# Patient Record
Sex: Male | Born: 1985 | Race: Black or African American | Hispanic: No | Marital: Single | Smoking: Former smoker
Health system: Southern US, Community
[De-identification: ages and names within clinical notes are randomized; demographics above are authoritative.]

## PROBLEM LIST (undated history)

## (undated) HISTORY — PX: OTHER SURGICAL HISTORY: SHX169

---

## 2015-09-29 ENCOUNTER — Emergency Department (HOSPITAL_COMMUNITY): Payer: Managed Care, Other (non HMO)

## 2015-09-29 ENCOUNTER — Encounter (HOSPITAL_COMMUNITY): Payer: Self-pay

## 2015-09-29 ENCOUNTER — Inpatient Hospital Stay (HOSPITAL_COMMUNITY)
Admission: EM | Admit: 2015-09-29 | Discharge: 2015-10-02 | DRG: 964 | Disposition: A | Discharge status: Home / Self Care | Payer: Managed Care, Other (non HMO) | Attending: General Surgery | Admitting: General Surgery

## 2015-09-29 DIAGNOSIS — S069X9A Unspecified intracranial injury with loss of consciousness of unspecified duration, initial encounter: Secondary | ICD-10-CM | POA: Diagnosis present

## 2015-09-29 DIAGNOSIS — S062X9A Diffuse traumatic brain injury with loss of consciousness of unspecified duration, initial encounter: Secondary | ICD-10-CM

## 2015-09-29 DIAGNOSIS — S0181XA Laceration without foreign body of other part of head, initial encounter: Secondary | ICD-10-CM

## 2015-09-29 DIAGNOSIS — S069XAA Unspecified intracranial injury with loss of consciousness status unknown, initial encounter: Secondary | ICD-10-CM | POA: Diagnosis present

## 2015-09-29 DIAGNOSIS — S27322A Contusion of lung, bilateral, initial encounter: Secondary | ICD-10-CM | POA: Diagnosis present

## 2015-09-29 DIAGNOSIS — F10920 Alcohol use, unspecified with intoxication, uncomplicated: Secondary | ICD-10-CM

## 2015-09-29 DIAGNOSIS — S062X0A Diffuse traumatic brain injury without loss of consciousness, initial encounter: Secondary | ICD-10-CM | POA: Diagnosis not present

## 2015-09-29 DIAGNOSIS — S01121A Laceration with foreign body of right eyelid and periocular area, initial encounter: Secondary | ICD-10-CM | POA: Diagnosis present

## 2015-09-29 DIAGNOSIS — F10129 Alcohol abuse with intoxication, unspecified: Secondary | ICD-10-CM | POA: Diagnosis present

## 2015-09-29 DIAGNOSIS — S22009A Unspecified fracture of unspecified thoracic vertebra, initial encounter for closed fracture: Secondary | ICD-10-CM | POA: Diagnosis present

## 2015-09-29 DIAGNOSIS — F17200 Nicotine dependence, unspecified, uncomplicated: Secondary | ICD-10-CM | POA: Diagnosis present

## 2015-09-29 DIAGNOSIS — R413 Other amnesia: Secondary | ICD-10-CM | POA: Diagnosis present

## 2015-09-29 DIAGNOSIS — S27329A Contusion of lung, unspecified, initial encounter: Secondary | ICD-10-CM

## 2015-09-29 DIAGNOSIS — D62 Acute posthemorrhagic anemia: Secondary | ICD-10-CM | POA: Diagnosis not present

## 2015-09-29 DIAGNOSIS — S062XAA Diffuse traumatic brain injury with loss of consciousness status unknown, initial encounter: Secondary | ICD-10-CM

## 2015-09-29 DIAGNOSIS — J982 Interstitial emphysema: Secondary | ICD-10-CM | POA: Diagnosis present

## 2015-09-29 DIAGNOSIS — E876 Hypokalemia: Secondary | ICD-10-CM | POA: Diagnosis present

## 2015-09-29 LAB — COMPREHENSIVE METABOLIC PANEL
ALBUMIN: 3.9 g/dL (ref 3.5–5.0)
ALT: 80 U/L — AB (ref 17–63)
AST: 119 U/L — AB (ref 15–41)
Alkaline Phosphatase: 64 U/L (ref 38–126)
Anion gap: 11 (ref 5–15)
BUN: 8 mg/dL (ref 6–20)
CHLORIDE: 98 mmol/L — AB (ref 101–111)
CO2: 27 mmol/L (ref 22–32)
CREATININE: 1.17 mg/dL (ref 0.61–1.24)
Calcium: 8.1 mg/dL — ABNORMAL LOW (ref 8.9–10.3)
GFR calc Af Amer: >60 mL/min (ref >60)
GLUCOSE: 147 mg/dL — AB (ref 65–99)
POTASSIUM: 3.4 mmol/L — AB (ref 3.5–5.1)
SODIUM: 136 mmol/L (ref 135–145)
Total Bilirubin: 0.4 mg/dL (ref 0.3–1.2)
Total Protein: 6.1 g/dL — ABNORMAL LOW (ref 6.5–8.1)

## 2015-09-29 LAB — CBC
HEMATOCRIT: 36.7 % — AB (ref 39.0–52.0)
Hemoglobin: 12.8 g/dL — ABNORMAL LOW (ref 13.0–17.0)
MCH: 32.7 pg (ref 26.0–34.0)
MCHC: 34.9 g/dL (ref 30.0–36.0)
MCV: 93.6 fL (ref 78.0–100.0)
PLATELETS: 140 10*3/uL — AB (ref 150–400)
RBC: 3.92 MIL/uL — ABNORMAL LOW (ref 4.22–5.81)
RDW: 13 % (ref 11.5–15.5)
WBC: 5.9 10*3/uL (ref 4.0–10.5)

## 2015-09-29 LAB — MRSA PCR SCREENING: MRSA by PCR: NEGATIVE

## 2015-09-29 LAB — SAMPLE TO BLOOD BANK

## 2015-09-29 LAB — ETHANOL: Alcohol, Ethyl (B): 298 mg/dL — ABNORMAL HIGH (ref <5)

## 2015-09-29 LAB — PROTIME-INR
INR: 1.06 (ref 0.00–1.49)
Prothrombin Time: 14 seconds (ref 11.6–15.2)

## 2015-09-29 MED ORDER — ACETAMINOPHEN 325 MG PO TABS: 650.0000 mg | ORAL_TABLET | ORAL | Status: DC | PRN

## 2015-09-29 MED ORDER — LORAZEPAM 2 MG/ML IJ SOLN
1.0000 mg | Freq: Once | INTRAMUSCULAR | Status: AC
  Administered 2015-09-29: 1 mg via INTRAVENOUS
  Filled 2015-09-29: qty 1

## 2015-09-29 MED ORDER — BISACODYL 10 MG RE SUPP: 10.0000 mg | Freq: Every day | RECTAL | Status: DC | PRN

## 2015-09-29 MED ORDER — OXYCODONE HCL 5 MG PO TABS
5.0000 mg | ORAL_TABLET | ORAL | Status: DC | PRN
  Administered 2015-10-01: 5 mg via ORAL
  Administered 2015-10-01 – 2015-10-02 (×3): 10 mg via ORAL
  Filled 2015-09-29: qty 2
  Filled 2015-09-29: qty 1
  Filled 2015-09-29 (×2): qty 2

## 2015-09-29 MED ORDER — MORPHINE SULFATE (PF) 4 MG/ML IV SOLN
4.0000 mg | Freq: Once | INTRAVENOUS | Status: AC
  Administered 2015-09-29: 4 mg via INTRAVENOUS
  Filled 2015-09-29: qty 1

## 2015-09-29 MED ORDER — POTASSIUM CHLORIDE IN NACL 20-0.9 MEQ/L-% IV SOLN
INTRAVENOUS | Status: DC
  Administered 2015-09-29 – 2015-10-01 (×6): via INTRAVENOUS
  Filled 2015-09-29 (×9): qty 1000

## 2015-09-29 MED ORDER — IOHEXOL 300 MG/ML  SOLN
100.0000 mL | Freq: Once | INTRAMUSCULAR | Status: AC | PRN
  Administered 2015-09-29: 100 mL via INTRAVENOUS

## 2015-09-29 MED ORDER — ONDANSETRON HCL 4 MG PO TABS: 4.0000 mg | ORAL_TABLET | Freq: Four times a day (QID) | ORAL | Status: DC | PRN

## 2015-09-29 MED ORDER — DOCUSATE SODIUM 100 MG PO CAPS
100.0000 mg | ORAL_CAPSULE | Freq: Two times a day (BID) | ORAL | Status: DC
  Administered 2015-09-29 – 2015-10-01 (×4): 100 mg via ORAL
  Filled 2015-09-29 (×6): qty 1

## 2015-09-29 MED ORDER — ONDANSETRON HCL 4 MG/2ML IJ SOLN
4.0000 mg | Freq: Once | INTRAMUSCULAR | Status: AC
  Administered 2015-09-29: 4 mg via INTRAVENOUS
  Filled 2015-09-29: qty 2

## 2015-09-29 MED ORDER — TETANUS-DIPHTH-ACELL PERTUSSIS 5-2.5-18.5 LF-MCG/0.5 IM SUSP
0.5000 mL | Freq: Once | INTRAMUSCULAR | Status: AC
  Administered 2015-09-29: 0.5 mL via INTRAMUSCULAR
  Filled 2015-09-29: qty 0.5

## 2015-09-29 MED ORDER — HYDROMORPHONE HCL 1 MG/ML IJ SOLN
0.5000 mg | INTRAMUSCULAR | Status: DC | PRN
  Administered 2015-09-29 – 2015-10-01 (×10): 1 mg via INTRAVENOUS
  Filled 2015-09-29 (×11): qty 1

## 2015-09-29 MED ORDER — ONDANSETRON HCL 4 MG/2ML IJ SOLN
4.0000 mg | Freq: Four times a day (QID) | INTRAMUSCULAR | Status: DC | PRN
  Administered 2015-09-29: 4 mg via INTRAVENOUS
  Filled 2015-09-29: qty 2
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
## 2015-09-29 NOTE — ED Notes (Signed)
See trauma narrator 
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
## 2015-09-29 NOTE — ED Notes (Signed)
Pt unable to sit still for CT scans; pt brought back to room

## 2015-09-29 NOTE — ED Notes (Signed)
Updated family on plan for pt
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
## 2015-09-29 NOTE — ED Notes (Signed)
Returned from ct scan 

## 2015-09-29 NOTE — Progress Notes (Signed)
   09/29/15 0400  Clinical Encounter Type  Visited With Patient;Health care provider  Visit Type Trauma  Referral From Nurse  Chaplain visited with Pt and healthcare provider; chaplain waited for family; brother of the Pt showed up; CHaplain is here upon request

## 2015-09-29 NOTE — ED Notes (Signed)
ENT at bedside
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
## 2015-09-29 NOTE — ED Notes (Signed)
MD at bedside. 

## 2015-09-29 NOTE — ED Notes (Signed)
Pt given ice chips.  Tolerated well.

## 2015-09-30 DIAGNOSIS — S01121A Laceration with foreign body of right eyelid and periocular area, initial encounter: Secondary | ICD-10-CM | POA: Diagnosis present

## 2015-09-30 DIAGNOSIS — R413 Other amnesia: Secondary | ICD-10-CM | POA: Diagnosis present

## 2015-09-30 DIAGNOSIS — F10129 Alcohol abuse with intoxication, unspecified: Secondary | ICD-10-CM | POA: Diagnosis present

## 2015-09-30 DIAGNOSIS — F17200 Nicotine dependence, unspecified, uncomplicated: Secondary | ICD-10-CM | POA: Diagnosis present

## 2015-09-30 DIAGNOSIS — D62 Acute posthemorrhagic anemia: Secondary | ICD-10-CM | POA: Diagnosis present

## 2015-09-30 DIAGNOSIS — S27322A Contusion of lung, bilateral, initial encounter: Secondary | ICD-10-CM | POA: Diagnosis present

## 2015-09-30 DIAGNOSIS — J982 Interstitial emphysema: Secondary | ICD-10-CM | POA: Diagnosis present

## 2015-09-30 DIAGNOSIS — E876 Hypokalemia: Secondary | ICD-10-CM | POA: Diagnosis present

## 2015-09-30 DIAGNOSIS — S062X0A Diffuse traumatic brain injury without loss of consciousness, initial encounter: Secondary | ICD-10-CM | POA: Diagnosis present

## 2015-09-30 DIAGNOSIS — S0181XA Laceration without foreign body of other part of head, initial encounter: Secondary | ICD-10-CM | POA: Diagnosis present

## 2015-09-30 LAB — COMPREHENSIVE METABOLIC PANEL
ALT: 81 U/L — ABNORMAL HIGH (ref 17–63)
AST: 185 U/L — ABNORMAL HIGH (ref 15–41)
Albumin: 3.3 g/dL — ABNORMAL LOW (ref 3.5–5.0)
Alkaline Phosphatase: 52 U/L (ref 38–126)
Anion gap: 8 (ref 5–15)
BUN: 7 mg/dL (ref 6–20)
CHLORIDE: 99 mmol/L — AB (ref 101–111)
CO2: 29 mmol/L (ref 22–32)
Calcium: 8.1 mg/dL — ABNORMAL LOW (ref 8.9–10.3)
Creatinine, Ser: 1.1 mg/dL (ref 0.61–1.24)
Glucose, Bld: 96 mg/dL (ref 65–99)
POTASSIUM: 3.9 mmol/L (ref 3.5–5.1)
SODIUM: 136 mmol/L (ref 135–145)
Total Bilirubin: 1.1 mg/dL (ref 0.3–1.2)
Total Protein: 5.3 g/dL — ABNORMAL LOW (ref 6.5–8.1)

## 2015-09-30 LAB — CBC
HCT: 29.9 % — ABNORMAL LOW (ref 39.0–52.0)
Hemoglobin: 10.2 g/dL — ABNORMAL LOW (ref 13.0–17.0)
MCH: 32.2 pg (ref 26.0–34.0)
MCHC: 34.1 g/dL (ref 30.0–36.0)
MCV: 94.3 fL (ref 78.0–100.0)
Platelets: 155 10*3/uL (ref 150–400)
RBC: 3.17 MIL/uL — ABNORMAL LOW (ref 4.22–5.81)
RDW: 13.1 % (ref 11.5–15.5)
WBC: 5.4 10*3/uL (ref 4.0–10.5)

## 2015-09-30 MED ORDER — MUPIROCIN 2 % EX OINT
TOPICAL_OINTMENT | CUTANEOUS | Status: AC
  Administered 2015-09-30: 01:00:00
  Filled 2015-09-30: qty 22

## 2015-09-30 MED ORDER — CETYLPYRIDINIUM CHLORIDE 0.05 % MT LIQD
7.0000 mL | Freq: Two times a day (BID) | OROMUCOSAL | Status: DC
  Administered 2015-09-30 (×2): 7 mL via OROMUCOSAL

## 2015-09-30 MED ORDER — CHLORHEXIDINE GLUCONATE 0.12 % MT SOLN
15.0000 mL | Freq: Two times a day (BID) | OROMUCOSAL | Status: DC
Start: 2015-09-30 — End: 2015-10-01
  Administered 2015-09-30 (×3): 15 mL via OROMUCOSAL
  Filled 2015-09-30 (×5): qty 15

## 2015-09-30 NOTE — Progress Notes (Signed)
PT Cancellation Note  Patient Details Name: Madelin Rearierre G Mihalko MRN: [id number] DOB: 1986/01/10   Cancelled Treatment:    Reason Eval/Treat Not Completed: Patient not medically ready. Pt continues to be on bedrest. Will need updated activity orders once appropriate for mobility and PT.  Michail JewelsAshley Parr PT, DPT [phone] Pager: [phone] 09/30/2015, 8:53 AM

## 2015-09-30 NOTE — Progress Notes (Signed)
Attempted to call report
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
## 2015-09-30 NOTE — Progress Notes (Signed)
Report called to Vassie RN on 6N.  

## 2015-10-01 LAB — COMPREHENSIVE METABOLIC PANEL
ALBUMIN: 2.8 g/dL — AB (ref 3.5–5.0)
ALK PHOS: 46 U/L (ref 38–126)
ALT: 62 U/L (ref 17–63)
ANION GAP: 10 (ref 5–15)
AST: 114 U/L — ABNORMAL HIGH (ref 15–41)
BILIRUBIN TOTAL: 0.9 mg/dL (ref 0.3–1.2)
BUN: 5 mg/dL — ABNORMAL LOW (ref 6–20)
CALCIUM: 8.5 mg/dL — AB (ref 8.9–10.3)
CO2: 27 mmol/L (ref 22–32)
CREATININE: 0.98 mg/dL (ref 0.61–1.24)
Chloride: 101 mmol/L (ref 101–111)
GFR calc non Af Amer: >60 mL/min (ref >60)
GLUCOSE: 88 mg/dL (ref 65–99)
Potassium: 3.7 mmol/L (ref 3.5–5.1)
Sodium: 138 mmol/L (ref 135–145)
TOTAL PROTEIN: 5.4 g/dL — AB (ref 6.5–8.1)

## 2015-10-01 LAB — CBC
HEMATOCRIT: 28.3 % — AB (ref 39.0–52.0)
HEMOGLOBIN: 9.3 g/dL — AB (ref 13.0–17.0)
MCH: 31.3 pg (ref 26.0–34.0)
MCHC: 32.9 g/dL (ref 30.0–36.0)
MCV: 95.3 fL (ref 78.0–100.0)
Platelets: 148 10*3/uL — ABNORMAL LOW (ref 150–400)
RBC: 2.97 MIL/uL — AB (ref 4.22–5.81)
RDW: 12.5 % (ref 11.5–15.5)
WBC: 6.1 10*3/uL (ref 4.0–10.5)
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
## 2015-10-02 DIAGNOSIS — S27322A Contusion of lung, bilateral, initial encounter: Secondary | ICD-10-CM | POA: Diagnosis present

## 2015-10-02 DIAGNOSIS — S069XAA Unspecified intracranial injury with loss of consciousness status unknown, initial encounter: Secondary | ICD-10-CM | POA: Diagnosis present

## 2015-10-02 DIAGNOSIS — S069X9A Unspecified intracranial injury with loss of consciousness of unspecified duration, initial encounter: Secondary | ICD-10-CM | POA: Diagnosis present

## 2015-10-02 DIAGNOSIS — S22009A Unspecified fracture of unspecified thoracic vertebra, initial encounter for closed fracture: Secondary | ICD-10-CM | POA: Diagnosis present

## 2015-10-02 DIAGNOSIS — D62 Acute posthemorrhagic anemia: Secondary | ICD-10-CM | POA: Diagnosis not present

## 2015-10-02 DIAGNOSIS — S0181XA Laceration without foreign body of other part of head, initial encounter: Secondary | ICD-10-CM | POA: Diagnosis present

## 2015-10-02 MED ORDER — OXYCODONE HCL 5 MG PO TABS
5.0000 mg | ORAL_TABLET | ORAL | Status: DC | PRN
  Administered 2015-10-02: 15 mg via ORAL
  Filled 2015-10-02: qty 3

## 2015-10-02 MED ORDER — OXYCODONE-ACETAMINOPHEN 7.5-325 MG PO TABS: 1.0000 | ORAL_TABLET | ORAL | Status: DC | PRN

## 2015-10-02 MED ORDER — HYDROMORPHONE HCL 1 MG/ML IJ SOLN: 0.5000 mg | INTRAMUSCULAR | Status: DC | PRN

## 2015-10-02 MED ORDER — POLYETHYLENE GLYCOL 3350 17 G PO PACK: 17.0000 g | PACK | Freq: Every day | ORAL | Status: DC

## 2015-10-02 MED ORDER — BACITRACIN ZINC 500 UNIT/GM EX OINT
TOPICAL_OINTMENT | Freq: Two times a day (BID) | CUTANEOUS | Status: DC
  Administered 2015-10-02: 11:00:00 via TOPICAL
  Filled 2015-10-02: qty 28.35
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
## 2015-10-02 NOTE — Progress Notes (Signed)
Frank BudgePierre Rajagopalan to be D/C'd home per MD order. Discussed with the patient and all questions fully answered.  VSS. Facial abrasions noted with bacitracin applied and wet-to-dry dressing to forehead.  IV catheter discontinued intact. Site without signs and symptoms of complications. Dressing and pressure applied.  An After Visit Summary was printed and given to the patient. Patient received prescription. Patient needs note for work regarding lifting restrictions. Attempted to page PA, who had already left. Per PA, patient needs to call trauma clinic tomorrow to obtain work note.  Patient agreeable to this.  D/c education completed with patient/family including follow up instructions, medication list, d/c activities limitations if indicated, with other d/c instructions as indicated by MD - patient able to verbalize understanding, all questions fully answered.   Patient instructed to return to ED, call 911, or call MD for any changes in condition.   Patient to be escorted via WC, and D/C home via private auto.
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
## 2015-10-02 NOTE — Progress Notes (Signed)
Inpatient Rehabilitation  We have received a prescreen request for possible IP Rehab from PT.  Note pt. is ambulating 200' with min guard assist though with some balance and awareness issues.  ST is recommending OP ST.  Per Casimiro NeedleMichael Jeffery's trauma note, pt. Does not want CIR.  At this time, we are not recommending IP Rehab consult due to the above reasons.  Please call if questions.  Weldon PickingSusan Amrita Radu PT Inpatient Rehab Admissions Coordinator Cell [phone] Office [phone]
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
## 2015-10-02 NOTE — Discharge Instructions (Signed)
Apply bacitracin ointment to all of the injured areas on the forehead twice daily.  No driving while taking oxycodone.

## 2015-10-06 ENCOUNTER — Telehealth (HOSPITAL_COMMUNITY): Payer: Self-pay | Admitting: Orthopedic Surgery

## 2015-10-09 NOTE — Telephone Encounter (Signed)
Told [facility] to let him know he could return to work any time as long as he wasn't needing his pain medication. Apparently he has a trauma clinic appointment 11/16.

## 2015-10-18 ENCOUNTER — Telehealth (HOSPITAL_COMMUNITY): Payer: Self-pay | Admitting: Orthopedic Surgery
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
## 2016-02-19 ENCOUNTER — Ambulatory Visit (INDEPENDENT_AMBULATORY_CARE_PROVIDER_SITE_OTHER): Payer: Managed Care, Other (non HMO) | Admitting: Family Medicine

## 2016-02-19 VITALS — BP 130/72 | HR 101 | Temp 99.2°F | Resp 16 | Ht 73.0 in | Wt 190.0 lb

## 2016-02-19 DIAGNOSIS — Z87820 Personal history of traumatic brain injury: Secondary | ICD-10-CM

## 2016-02-19 DIAGNOSIS — Z0000 Encounter for general adult medical examination without abnormal findings: Secondary | ICD-10-CM

## 2016-02-19 LAB — COMPREHENSIVE METABOLIC PANEL
ALT: 43 U/L (ref 9–46)
AST: 43 U/L — ABNORMAL HIGH (ref 10–40)
Albumin: 4.6 g/dL (ref 3.6–5.1)
Alkaline Phosphatase: 86 U/L (ref 40–115)
BUN: 10 mg/dL (ref 7–25)
CHLORIDE: 103 mmol/L (ref 98–110)
CO2: 27 mmol/L (ref 20–31)
Calcium: 9.2 mg/dL (ref 8.6–10.3)
Creat: 1.05 mg/dL (ref 0.60–1.35)
Glucose, Bld: 96 mg/dL (ref 65–99)
POTASSIUM: 3.8 mmol/L (ref 3.5–5.3)
Sodium: 138 mmol/L (ref 135–146)
TOTAL PROTEIN: 7.2 g/dL (ref 6.1–8.1)
Total Bilirubin: 0.5 mg/dL (ref 0.2–1.2)

## 2016-02-19 LAB — POCT CBC
Granulocyte percent: 65.7 %G (ref 37–80)
HEMATOCRIT: 41.6 % — AB (ref 43.5–53.7)
HEMOGLOBIN: 14.5 g/dL (ref 14.1–18.1)
LYMPH, POC: 1.4 (ref 0.6–3.4)
MCH: 31.3 pg — AB (ref 27–31.2)
MCHC: 35 g/dL (ref 31.8–35.4)
MCV: 89.5 fL (ref 80–97)
MID (cbc): 0.2 (ref 0–0.9)
MPV: 7.3 fL (ref 0–99.8)
POC GRANULOCYTE: 3.1 (ref 2–6.9)
POC LYMPH PERCENT: 29.9 %L (ref 10–50)
POC MID %: 4.4 % (ref 0–12)
Platelet Count, POC: 204 10*3/uL (ref 142–424)
RBC: 4.64 M/uL — AB (ref 4.69–6.13)
RDW, POC: 13.4 %
WBC: 4.7 10*3/uL (ref 4.6–10.2)

## 2016-02-19 LAB — LIPID PANEL
CHOL/HDL RATIO: 3.5 ratio (ref <=5.0)
CHOLESTEROL: 241 mg/dL — AB (ref 125–200)
HDL: 68 mg/dL (ref >=40)
LDL Cholesterol: 125 mg/dL (ref <130)
TRIGLYCERIDES: 242 mg/dL — AB (ref <150)
VLDL: 48 mg/dL — AB (ref <30)

## 2016-02-19 NOTE — Patient Instructions (Addendum)
Never drink and drive  Advise regular exercise  Talk to your eye doctor about the possibility of using an eye moistening ointment at bedtime.  Return as needed.    Consider looking into operations research with your medicine and agree.
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
## 2016-02-19 NOTE — Progress Notes (Signed)
   Subjective:    Patient ID: Frank Reese, male    DOB: [birth year], 30 y.o.   MRN: [id number]  HPI    Review of Systems  Constitutional: Negative.   HENT: Negative.   Eyes: Negative.   Respiratory: Negative.   Cardiovascular: Negative.   Gastrointestinal: Negative.   Endocrine: Negative.   Genitourinary: Negative.   Musculoskeletal: Negative.   Skin: Negative.   Allergic/Immunologic: Negative.   Neurological: Negative.   Hematological: Negative.   Psychiatric/Behavioral: Negative.        Objective:   Physical Exam        Assessment & Plan:

## 2016-02-21 ENCOUNTER — Other Ambulatory Visit: Payer: Self-pay | Admitting: *Deleted

## 2016-02-21 MED ORDER — ROSUVASTATIN CALCIUM 5 MG PO TABS: 5.0000 mg | ORAL_TABLET | Freq: Every day | ORAL | Status: DC

## 2016-03-12 ENCOUNTER — Ambulatory Visit (INDEPENDENT_AMBULATORY_CARE_PROVIDER_SITE_OTHER): Payer: Managed Care, Other (non HMO) | Admitting: Internal Medicine

## 2016-03-12 VITALS — BP 142/92 | HR 84 | Temp 97.9°F | Resp 17 | Ht 74.0 in | Wt 189.0 lb

## 2016-03-12 DIAGNOSIS — H578 Other specified disorders of eye and adnexa: Secondary | ICD-10-CM

## 2016-03-12 DIAGNOSIS — H5789 Other specified disorders of eye and adnexa: Secondary | ICD-10-CM

## 2016-03-12 MED ORDER — ARTIFICIAL TEARS OP OINT: TOPICAL_OINTMENT | Freq: Three times a day (TID) | OPHTHALMIC | Status: DC

## 2016-03-12 NOTE — Patient Instructions (Signed)
     IF you received an x-ray today, you will receive an invoice from [facility]. Please contact [facility] at [phone] with questions or concerns regarding your invoice.   IF you received labwork today, you will receive an invoice from [facility]. Please contact [facility] at [phone] with questions or concerns regarding your invoice.   Our billing staff will not be able to assist you with questions regarding bills from these companies.  You will be contacted with the lab results as soon as they are available. The fastest way to get your results is to activate your My Chart account. Instructions are located on the last page of this paperwork. If you have not heard from us regarding the results in 2 weeks, please contact this office.      
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
## 2016-04-18 ENCOUNTER — Ambulatory Visit (INDEPENDENT_AMBULATORY_CARE_PROVIDER_SITE_OTHER): Payer: Managed Care, Other (non HMO) | Admitting: Physician Assistant

## 2016-04-18 VITALS — BP 128/80 | HR 91 | Temp 98.6°F | Resp 16 | Ht 73.0 in | Wt 187.0 lb

## 2016-04-18 DIAGNOSIS — H109 Unspecified conjunctivitis: Secondary | ICD-10-CM | POA: Diagnosis not present

## 2016-04-18 MED ORDER — POLYMYXIN B-TRIMETHOPRIM 10000-0.1 UNIT/ML-% OP SOLN: 1.0000 [drp] | OPHTHALMIC | Status: AC

## 2016-04-18 NOTE — Patient Instructions (Addendum)
You can apply artificial tears for irritation Apply antibacterial drops, 1 drop in right eye every 4 hours for 7 days. If moves into left eye, do same thing in left eye Warm compresses can help. Practice good hand hygiene. Return if not getting better in 7 days. Return at any time if you develop eye pain, vision change, fever or chills.     IF you received an x-ray today, you will receive an invoice from [facility]. Please contact [facility] at [phone] with questions or concerns regarding your invoice.   IF you received labwork today, you will receive an invoice from [facility]. Please contact [facility] at [phone] with questions or concerns regarding your invoice.   Our billing staff will not be able to assist you with questions regarding bills from these companies.  You will be contacted with the lab results as soon as they are available. The fastest way to get your results is to activate your My Chart account. Instructions are located on the last page of this paperwork. If you have not heard from us regarding the results in 2 weeks, please contact this office.
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
## 2016-10-15 ENCOUNTER — Telehealth: Payer: Self-pay

## 2016-10-15 NOTE — Telephone Encounter (Signed)
Pt dropped off [place] form to be completed    Best phone for pt is [phone]

## 2016-10-16 NOTE — Telephone Encounter (Signed)
Completed to the best of my ability but we did not had tb screen, immunization list. Also, Dr. 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

## 2016-10-18 NOTE — Telephone Encounter (Signed)
V/m not set up.  Copy up front for patient and copy for scanning.

## 2017-02-25 ENCOUNTER — Ambulatory Visit (INDEPENDENT_AMBULATORY_CARE_PROVIDER_SITE_OTHER): Payer: Self-pay | Admitting: Physician Assistant

## 2017-02-25 VITALS — BP 130/89 | HR 77 | Temp 98.3°F | Resp 17 | Ht 74.5 in | Wt 188.0 lb

## 2017-02-25 DIAGNOSIS — B309 Viral conjunctivitis, unspecified: Secondary | ICD-10-CM

## 2017-02-25 MED ORDER — NAPHAZOLINE HCL 0.1 % OP SOLN: 1.0000 [drp] | Freq: Four times a day (QID) | OPHTHALMIC | 0 refills | Status: DC | PRN
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
## 2017-02-25 NOTE — Patient Instructions (Addendum)
We will treat this as viral conjucntivitis, which is highly contagious. Just so you know, There is no cure for viral conjunctivitis. Recovery can begin within days, although the symptoms frequently get worse for the first three to five days, with gradual improvement over the following one to two weeks for a total course of two to three weeks. Some people experience morning crusting that continues for up to two weeks after the initial symptoms, although the daytime redness, irritation, and tearing should be much improved. Please use the drops prescribed for relief. And continue to wash your hands. If you start to get pus like drainage consistently throughout the day, please come back to our office or call and let me know.   Viral Conjunctivitis, Adult Viral conjunctivitis is an inflammation of the clear membrane that covers the white part of your eye and the inner surface of your eyelid (conjunctiva). The inflammation is caused by a viral infection. The blood vessels in the conjunctiva become inflamed, causing the eye to become red or pink, and often itchy. Viral conjunctivitis can be easily passed from one person to another (is contagious). This condition is often called pink eye. What are the causes? This condition is caused by a virus. A virus is a type of contagious germ. It can be spread by touching objects that have been contaminated with the virus, such as doorknobs or towels. It can also be passed through droplets, such as from coughing or sneezing. What are the signs or symptoms? Symptoms of this condition include:  Eye redness.  Tearing or watery eyes.  Itchy and irritated eyes.  Burning feeling in the eyes.  Clear drainage from the eye.  Swollen eyelids.  A gritty feeling in the eye.  Light sensitivity. This condition often occurs with other symptoms, such as a fever, nausea, or a rash. How is this diagnosed? This condition is diagnosed with a medical history and physical exam. If  you have discharge from your eye, the discharge may be tested to rule out other causes of conjunctivitis. How is this treated? Viral conjunctivitis does not respond to medicines that kill bacteria (antibiotics). Treatment for viral conjunctivitis is directed at stopping a bacterial infection from developing in addition to the viral infection. Treatment also aims to relieve your symptoms, such as itching. This may be done with antihistamine drops or other eye medicines. Rarely, steroid eye drops or antiviral medicines may be prescribed. Follow these instructions at home: Medicines    Take or apply over-the-counter and prescription medicines only as told by your health care provider.  Be very careful to avoid touching the edge of the eyelid with the eye drop bottle or ointment tube when applying medicines to the affected eye. Being careful this way will stop you from spreading the infection to the other eye or to other people. Eye care   Avoid touching or rubbing your eyes.  Apply a warm, wet, clean washcloth to your eye for 10-20 minutes, 3-4 times per day or as told by your health care provider.  If you wear contact lenses, do not wear them until the inflammation is gone and your health care provider says it is safe to wear them again. Ask your health care provider how to sterilize or replace your contact lenses before using them again. Wear glasses until you can resume wearing contacts.  Avoid wearing eye makeup until the inflammation is gone. Throw away any old eye cosmetics that may be contaminated.  Gently wipe away any drainage from  your eye with a warm, wet washcloth or a cotton ball. General instructions   Change or wash your pillowcase every day or as told by your health care provider.  Do not share towels, pillowcases, washcloths, eye makeup, makeup brushes, contact lenses, or glasses. This may spread the infection.  Wash your hands often with soap and water. Use paper towels to  dry your hands. If soap and water are not available, use hand sanitizer.  Try to avoid contact with other people for one week or as told by your health care provider. Contact a health care provider if:  Your symptoms do not improve with treatment or they get worse.  You have increased pain.  Your vision becomes blurry.  You have a fever.  You have facial pain, redness, or swelling.  You have yellow or green drainage coming from your eye.  You have new symptoms. This information is not intended to replace advice given to you by your health care provider. Make sure you discuss any questions you have with your health care provider. Document Released: 02/08/2003 Document Revised: 06/15/2016 Document Reviewed: 06/04/2016 Elsevier Interactive Patient Education  2017 [employer]     IF you received an x-ray today, you will receive an invoice from [facility]. Please contact [facility] at [phone] with questions or concerns regarding your invoice.   IF you received labwork today, you will receive an invoice from [facility]. Please contact [facility] at [phone] with questions or concerns regarding your invoice.   Our billing staff will not be able to assist you with questions regarding bills from these companies.  You will be contacted with the lab results as soon as they are available. The fastest way to get your results is to activate your My Chart account. Instructions are located on the last page of this paperwork. If you have not heard from us regarding the results in 2 weeks, please contact this office.

## 2017-03-01 ENCOUNTER — Telehealth: Payer: Self-pay

## 2017-03-01 NOTE — Telephone Encounter (Signed)
Requesting alt eye drop, unable to get in naphazoline  Please sub

## 2017-03-01 NOTE — Telephone Encounter (Signed)
Lets try systane OTC.   I've red PA Wieseman's note and there does not seem to be a serious etiology at play.

## 2017-03-18 NOTE — Telephone Encounter (Signed)
Lm

## 2018-07-21 ENCOUNTER — Other Ambulatory Visit: Payer: Self-pay

## 2018-07-21 ENCOUNTER — Ambulatory Visit (INDEPENDENT_AMBULATORY_CARE_PROVIDER_SITE_OTHER): Payer: 59 | Admitting: Physician Assistant

## 2018-07-21 ENCOUNTER — Encounter (HOSPITAL_COMMUNITY): Payer: Self-pay

## 2018-07-21 ENCOUNTER — Inpatient Hospital Stay (HOSPITAL_COMMUNITY): Payer: 59

## 2018-07-21 ENCOUNTER — Telehealth: Payer: Self-pay | Admitting: Physician Assistant

## 2018-07-21 ENCOUNTER — Encounter: Payer: Self-pay | Admitting: Physician Assistant

## 2018-07-21 ENCOUNTER — Emergency Department (HOSPITAL_COMMUNITY): Payer: 59

## 2018-07-21 ENCOUNTER — Inpatient Hospital Stay (HOSPITAL_COMMUNITY)
Admission: EM | Admit: 2018-07-21 | Discharge: 2018-07-24 | DRG: 439 | Disposition: A | Discharge status: Home / Self Care | Payer: 59 | Attending: Internal Medicine | Admitting: Internal Medicine

## 2018-07-21 VITALS — BP 138/54 | HR 156 | Temp 99.6°F | Resp 16 | Ht 74.5 in | Wt 170.0 lb

## 2018-07-21 DIAGNOSIS — E876 Hypokalemia: Secondary | ICD-10-CM | POA: Diagnosis present

## 2018-07-21 DIAGNOSIS — R739 Hyperglycemia, unspecified: Secondary | ICD-10-CM

## 2018-07-21 DIAGNOSIS — F1010 Alcohol abuse, uncomplicated: Secondary | ICD-10-CM | POA: Diagnosis present

## 2018-07-21 DIAGNOSIS — K852 Alcohol induced acute pancreatitis without necrosis or infection: Secondary | ICD-10-CM

## 2018-07-21 DIAGNOSIS — K859 Acute pancreatitis without necrosis or infection, unspecified: Secondary | ICD-10-CM

## 2018-07-21 DIAGNOSIS — Z720 Tobacco use: Secondary | ICD-10-CM | POA: Diagnosis present

## 2018-07-21 DIAGNOSIS — R100 Acute abdomen: Secondary | ICD-10-CM | POA: Diagnosis not present

## 2018-07-21 DIAGNOSIS — F10239 Alcohol dependence with withdrawal, unspecified: Secondary | ICD-10-CM | POA: Diagnosis present

## 2018-07-21 DIAGNOSIS — E785 Hyperlipidemia, unspecified: Secondary | ICD-10-CM | POA: Diagnosis present

## 2018-07-21 DIAGNOSIS — Z87820 Personal history of traumatic brain injury: Secondary | ICD-10-CM

## 2018-07-21 DIAGNOSIS — K760 Fatty (change of) liver, not elsewhere classified: Secondary | ICD-10-CM | POA: Diagnosis present

## 2018-07-21 DIAGNOSIS — R7989 Other specified abnormal findings of blood chemistry: Secondary | ICD-10-CM | POA: Diagnosis present

## 2018-07-21 DIAGNOSIS — R000 Tachycardia, unspecified: Secondary | ICD-10-CM | POA: Diagnosis present

## 2018-07-21 DIAGNOSIS — E872 Acidosis: Secondary | ICD-10-CM | POA: Diagnosis present

## 2018-07-21 DIAGNOSIS — R945 Abnormal results of liver function studies: Secondary | ICD-10-CM | POA: Diagnosis present

## 2018-07-21 DIAGNOSIS — R6510 Systemic inflammatory response syndrome (SIRS) of non-infectious origin without acute organ dysfunction: Secondary | ICD-10-CM | POA: Diagnosis present

## 2018-07-21 DIAGNOSIS — K2970 Gastritis, unspecified, without bleeding: Secondary | ICD-10-CM | POA: Diagnosis present

## 2018-07-21 DIAGNOSIS — F17200 Nicotine dependence, unspecified, uncomplicated: Secondary | ICD-10-CM | POA: Diagnosis present

## 2018-07-21 DIAGNOSIS — R160 Hepatomegaly, not elsewhere classified: Secondary | ICD-10-CM | POA: Diagnosis present

## 2018-07-21 DIAGNOSIS — R109 Unspecified abdominal pain: Secondary | ICD-10-CM | POA: Diagnosis not present

## 2018-07-21 DIAGNOSIS — N179 Acute kidney failure, unspecified: Secondary | ICD-10-CM | POA: Diagnosis present

## 2018-07-21 LAB — POCT CBC
Granulocyte percent: 91.3 %G — AB (ref 37–80)
HCT, POC: 44.1 % (ref 43.5–53.7)
Hemoglobin: 15.1 g/dL (ref 14.1–18.1)
LYMPH, POC: 0.8 (ref 0.6–3.4)
MCH, POC: 35.8 pg — AB (ref 27–31.2)
MCHC: 34.4 g/dL (ref 31.8–35.4)
MCV: 104.1 fL — AB (ref 80–97)
MID (CBC): 0.5 (ref 0–0.9)
MPV: 6.6 fL (ref 0–99.8)
PLATELET COUNT, POC: 250 10*3/uL (ref 142–424)
POC Granulocyte: 13.4 — AB (ref 2–6.9)
POC LYMPH %: 5.3 % — AB (ref 10–50)
POC MID %: 3.4 % (ref 0–12)
RBC: 4.24 M/uL — AB (ref 4.69–6.13)
RDW, POC: 15.1 %
WBC: 14.7 10*3/uL — AB (ref 4.6–10.2)

## 2018-07-21 LAB — CBC
HCT: 46.7 % (ref 39.0–52.0)
HEMOGLOBIN: 16.3 g/dL (ref 13.0–17.0)
MCH: 36.7 pg — AB (ref 26.0–34.0)
MCHC: 34.9 g/dL (ref 30.0–36.0)
MCV: 105.2 fL — ABNORMAL HIGH (ref 78.0–100.0)
Platelets: 247 10*3/uL (ref 150–400)
RBC: 4.44 MIL/uL (ref 4.22–5.81)
RDW: 13.9 % (ref 11.5–15.5)
WBC: 17 10*3/uL — ABNORMAL HIGH (ref 4.0–10.5)

## 2018-07-21 LAB — TSH: TSH: 2.109 u[IU]/mL (ref 0.350–4.500)

## 2018-07-21 LAB — COMPREHENSIVE METABOLIC PANEL
ALBUMIN: 4.3 g/dL (ref 3.5–5.0)
ALK PHOS: 153 U/L — AB (ref 38–126)
ALT: 61 U/L — AB (ref 0–44)
ANION GAP: 17 — AB (ref 5–15)
AST: 69 U/L — AB (ref 15–41)
BUN: 11 mg/dL (ref 6–20)
CALCIUM: 9.3 mg/dL (ref 8.9–10.3)
CO2: 22 mmol/L (ref 22–32)
Chloride: 98 mmol/L (ref 98–111)
Creatinine, Ser: 1.26 mg/dL — ABNORMAL HIGH (ref 0.61–1.24)
GFR calc Af Amer: >60 mL/min (ref >60)
GFR calc non Af Amer: >60 mL/min (ref >60)
GLUCOSE: 153 mg/dL — AB (ref 70–99)
Potassium: 3.6 mmol/L (ref 3.5–5.1)
SODIUM: 137 mmol/L (ref 135–145)
Total Bilirubin: 2 mg/dL — ABNORMAL HIGH (ref 0.3–1.2)
Total Protein: 8.2 g/dL — ABNORMAL HIGH (ref 6.5–8.1)

## 2018-07-21 LAB — URINALYSIS, ROUTINE W REFLEX MICROSCOPIC
BACTERIA UA: NONE SEEN
Bilirubin Urine: NEGATIVE
GLUCOSE, UA: NEGATIVE mg/dL
Ketones, ur: NEGATIVE mg/dL
Leukocytes, UA: NEGATIVE
Nitrite: NEGATIVE
PROTEIN: 30 mg/dL — AB
Specific Gravity, Urine: >1.046 — ABNORMAL HIGH (ref 1.005–1.030)
pH: 5 (ref 5.0–8.0)

## 2018-07-21 LAB — PHOSPHORUS: PHOSPHORUS: 2.4 mg/dL — AB (ref 2.5–4.6)

## 2018-07-21 LAB — LIPASE, BLOOD: Lipase: 521 U/L — ABNORMAL HIGH (ref 11–51)

## 2018-07-21 LAB — MAGNESIUM: Magnesium: 1.4 mg/dL — ABNORMAL LOW (ref 1.7–2.4)

## 2018-07-21 MED ORDER — LORAZEPAM 2 MG/ML IJ SOLN
1.0000 mg | Freq: Four times a day (QID) | INTRAMUSCULAR | Status: DC | PRN
  Administered 2018-07-22: 1 mg via INTRAVENOUS
  Filled 2018-07-21: qty 1

## 2018-07-21 MED ORDER — ONDANSETRON HCL 4 MG/2ML IJ SOLN: 4.0000 mg | Freq: Four times a day (QID) | INTRAMUSCULAR | Status: DC | PRN

## 2018-07-21 MED ORDER — ONDANSETRON HCL 4 MG PO TABS: 4.0000 mg | ORAL_TABLET | Freq: Four times a day (QID) | ORAL | Status: DC | PRN

## 2018-07-21 MED ORDER — ADULT MULTIVITAMIN W/MINERALS CH
1.0000 | ORAL_TABLET | Freq: Every day | ORAL | Status: DC
  Administered 2018-07-22 – 2018-07-24 (×3): 1 via ORAL
  Filled 2018-07-21 (×3): qty 1

## 2018-07-21 MED ORDER — FOLIC ACID 1 MG PO TABS
1.0000 mg | ORAL_TABLET | Freq: Every day | ORAL | Status: DC
  Administered 2018-07-22 – 2018-07-24 (×3): 1 mg via ORAL
  Filled 2018-07-21 (×3): qty 1

## 2018-07-21 MED ORDER — HYDROMORPHONE HCL 1 MG/ML IJ SOLN
1.0000 mg | INTRAMUSCULAR | Status: DC | PRN
  Administered 2018-07-21 – 2018-07-23 (×8): 1 mg via INTRAVENOUS
  Filled 2018-07-21 (×9): qty 1

## 2018-07-21 MED ORDER — ACETAMINOPHEN 325 MG PO TABS
650.0000 mg | ORAL_TABLET | Freq: Four times a day (QID) | ORAL | Status: DC | PRN
  Administered 2018-07-22 – 2018-07-23 (×2): 650 mg via ORAL
  Filled 2018-07-21 (×3): qty 2

## 2018-07-21 MED ORDER — ALBUTEROL SULFATE (2.5 MG/3ML) 0.083% IN NEBU: 2.5000 mg | INHALATION_SOLUTION | RESPIRATORY_TRACT | Status: DC | PRN

## 2018-07-21 MED ORDER — BISACODYL 10 MG RE SUPP: 10.0000 mg | Freq: Every day | RECTAL | Status: DC | PRN

## 2018-07-21 MED ORDER — POLYETHYLENE GLYCOL 3350 17 G PO PACK: 17.0000 g | PACK | Freq: Every day | ORAL | Status: DC | PRN

## 2018-07-21 MED ORDER — LORAZEPAM 1 MG PO TABS: 1.0000 mg | ORAL_TABLET | Freq: Four times a day (QID) | ORAL | Status: DC | PRN

## 2018-07-21 MED ORDER — NICOTINE 14 MG/24HR TD PT24
14.0000 mg | MEDICATED_PATCH | Freq: Every day | TRANSDERMAL | Status: DC
  Administered 2018-07-21 – 2018-07-24 (×4): 14 mg via TRANSDERMAL
  Filled 2018-07-21 (×5): qty 1

## 2018-07-21 MED ORDER — SODIUM CHLORIDE 0.9 % IV BOLUS: 1000.0000 mL | Freq: Once | INTRAVENOUS | Status: DC

## 2018-07-21 MED ORDER — ENOXAPARIN SODIUM 40 MG/0.4ML ~~LOC~~ SOLN
40.0000 mg | SUBCUTANEOUS | Status: DC
  Administered 2018-07-21 – 2018-07-23 (×3): 40 mg via SUBCUTANEOUS
  Filled 2018-07-21 (×3): qty 0.4

## 2018-07-21 MED ORDER — HYDROMORPHONE HCL 1 MG/ML IJ SOLN
1.0000 mg | Freq: Once | INTRAMUSCULAR | Status: AC
  Administered 2018-07-21: 1 mg via INTRAVENOUS
  Filled 2018-07-21: qty 1

## 2018-07-21 MED ORDER — VITAMIN B-1 100 MG PO TABS
100.0000 mg | ORAL_TABLET | Freq: Every day | ORAL | Status: DC
  Administered 2018-07-22 – 2018-07-24 (×3): 100 mg via ORAL
  Filled 2018-07-21 (×3): qty 1

## 2018-07-21 MED ORDER — FENTANYL CITRATE (PF) 100 MCG/2ML IJ SOLN: 50.0000 ug | Freq: Once | INTRAMUSCULAR | Status: DC

## 2018-07-21 MED ORDER — ACETAMINOPHEN 650 MG RE SUPP
650.0000 mg | Freq: Four times a day (QID) | RECTAL | Status: DC | PRN
  Administered 2018-07-22: 650 mg via RECTAL
  Filled 2018-07-21: qty 1

## 2018-07-21 MED ORDER — SODIUM CHLORIDE 0.9 % IV BOLUS
1000.0000 mL | Freq: Once | INTRAVENOUS | Status: AC
  Administered 2018-07-21: 1000 mL via INTRAVENOUS

## 2018-07-21 MED ORDER — IOPAMIDOL (ISOVUE-300) INJECTION 61%
100.0000 mL | Freq: Once | INTRAVENOUS | Status: AC | PRN
  Administered 2018-07-21: 100 mL via INTRAVENOUS

## 2018-07-21 MED ORDER — IOPAMIDOL (ISOVUE-300) INJECTION 61%
INTRAVENOUS | Status: AC
  Filled 2018-07-21: qty 100

## 2018-07-21 MED ORDER — SODIUM CHLORIDE 0.9 % IV SOLN
INTRAVENOUS | Status: DC
  Administered 2018-07-21 – 2018-07-24 (×8): via INTRAVENOUS

## 2018-07-21 MED ORDER — THIAMINE HCL 100 MG/ML IJ SOLN
100.0000 mg | Freq: Every day | INTRAMUSCULAR | Status: DC
  Administered 2018-07-21: 100 mg via INTRAVENOUS
  Filled 2018-07-21: qty 2

## 2018-07-21 NOTE — ED Triage Notes (Signed)
Patient reports that he went to primary care with c/o lower abdominal pain and vomiting. No BM x 2 days. Abdomen slightly distended. Patient found to have HR- 154 in triage.

## 2018-07-21 NOTE — Progress Notes (Signed)
RN called ED to follow up on patient transfer. Rn was advised that ultrasound is at bedside and transfer will be delayed.

## 2018-07-21 NOTE — Progress Notes (Signed)
07/21/2018 1:39 PM   DOB: August 02, 1986 / MRN: [id number]  SUBJECTIVE:  Frank Reese is a 32 y.o. male presenting for abdominal pain with nausea. Symptoms present for about 2 days and worsening. The pain started on the left then became more periumbilical. He denies dizziness at this time. No previous abdominal surgery. Last BM last Sunday and non bloody.  No SOB or chest pain.   He has No Known Allergies.   He  has no past medical history on file.    He  reports that he has quit smoking. He has never used smokeless tobacco. He reports that he drinks about 7.0 standard drinks of alcohol per week. He reports that he does not use drugs. He  reports that he does not engage in sexual activity. The patient  has a past surgical history that includes left ankle surgery and right arm surgery.  His family history is not on file.  ROS PER HPI.   The problem list and medications were reviewed and updated by myself where necessary and exist elsewhere in the encounter.   OBJECTIVE:  BP (!) 138/54   Pulse (!) 156   Temp 99.6 F (37.6 C)   Resp 16   Ht 6' 2.5" (1.892 m)   Wt 170 lb (77.1 kg)   SpO2 97%   BMI 21.53 kg/m   Wt Readings from Last 3 Encounters:  07/21/18 170 lb (77.1 kg)  02/25/17 188 lb (85.3 kg)  04/18/16 187 lb (84.8 kg)   Temp Readings from Last 3 Encounters:  07/21/18 99.6 F (37.6 C)  02/25/17 98.3 F (36.8 C) (Oral)  04/18/16 98.6 F (37 C) (Oral)   BP Readings from Last 3 Encounters:  07/21/18 (!) 138/54  02/25/17 130/89  04/18/16 128/80   Pulse Readings from Last 3 Encounters:  07/21/18 (!) 156  02/25/17 77  04/18/16 91    Physical Exam  Constitutional:  Non-toxic appearance. He has a sickly appearance. He appears ill. No distress.  Cardiovascular: Regular rhythm.  No extrasystoles are present. Tachycardia present.  Pulmonary/Chest: Effort normal.  Abdominal: Soft. Normal appearance. Bowel sounds are decreased. There is tenderness (bilateral lower  quadrants, no rebound on left or right. Mild positive PSOAS sign. ).    No results found for: HGBA1C  Lab Results  Component Value Date   WBC 14.7 (A) 07/21/2018   HGB 15.1 07/21/2018   HCT 44.1 07/21/2018   MCV 104.1 (A) 07/21/2018   PLT 148 (L) 10/01/2015    Lab Results  Component Value Date   CREATININE 1.05 02/19/2016   BUN 10 02/19/2016   NA 138 02/19/2016   K 3.8 02/19/2016   CL 103 02/19/2016   CO2 27 02/19/2016    Lab Results  Component Value Date   ALT 43 02/19/2016   AST 43 (H) 02/19/2016   ALKPHOS 86 02/19/2016   BILITOT 0.5 02/19/2016    No results found for: TSH  Lab Results  Component Value Date   CHOL 241 (H) 02/19/2016   HDL 68 02/19/2016   LDLCALC 125 02/19/2016   TRIG 242 (H) 02/19/2016   CHOLHDL 3.5 02/19/2016     ASSESSMENT AND PLAN:  Frank Reese was seen today for abdominal pain.  Diagnoses and all orders for this visit:  Abdominal pain, unspecified abdominal location -     POCT CBC  Acute abdomen: He refused EMS.  [facility]. I think he has probably suffering  from fulminant appendicitis.  -     POCT CBC    The patient is advised to call or return to clinic if he does not see an improvement in symptoms, or to seek the care of the closest emergency department if he worsens with the above plan.   Frank Reese, MHS, PA-C [facility] 07/21/2018 1:39 PM
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
## 2018-07-21 NOTE — ED Notes (Signed)
ED TO INPATIENT HANDOFF REPORT  Name/Age/Gender Frank Reese 32 y.o. male  Code Status Code Status History    Date Active Date Inactive Code Status Order ID Comments User Context   09/29/2015 0915 10/02/2015 2008 Full Code [id number]  [facility]      Home/SNF/Other Home  Chief Complaint Abdominal pain, sent by dr  Level of Care/Admitting Diagnosis ED Disposition    ED Disposition Condition [place]: [facility] [[id number]]  Level of Care: Telemetry [5]  Admit to tele based on following criteria: Monitor QTC interval  Admit to tele based on following criteria: Monitor for Ischemic changes  Diagnosis: Acute pancreatitis [577.0.ICD-9-CM]  Admitting Physician: Raiford Noble LATIF [[id number]]  Attending Physician: Raiford Noble LATIF [[id number]]  Estimated length of stay: past midnight tomorrow  Certification:: I certify this patient will need inpatient services for at least 2 midnights  PT Class (Do Not Modify): Inpatient [101]  PT Acc Code (Do Not Modify): Private [1]       Medical History History reviewed. No pertinent past medical history.  Allergies No Known Allergies  IV Location/Drains/Wounds Patient Lines/Drains/Airways Status   Active Line/Drains/Airways    Name:   Placement date:   Placement time:   Site:   Days:   Peripheral IV 07/21/18 Right Antecubital   07/21/18    1417    Antecubital   less than 1   Wound / Incision (Open or Dehisced) 09/29/15 Laceration Eye Right   09/29/15    2109    Eye   1026          Labs/Imaging Results for orders placed or performed during the hospital encounter of 07/21/18 (from the past 48 hour(s))  Lipase, blood     Status: Abnormal   Collection Time: 07/21/18  2:16 PM  Result Value Ref Range   Lipase 521 (H) 11 - 51 U/L    Comment: RESULTS CONFIRMED BY MANUAL DILUTION Performed at [facility] [address] [place], [place] [address]    Comprehensive metabolic panel     Status: Abnormal   Collection Time: 07/21/18  2:16 PM  Result Value Ref Range   Sodium 137 135 - 145 mmol/L   Potassium 3.6 3.5 - 5.1 mmol/L   Chloride 98 98 - 111 mmol/L   CO2 22 22 - 32 mmol/L   Glucose, Bld 153 (H) 70 - 99 mg/dL   BUN 11 6 - 20 mg/dL   Creatinine, Ser 1.26 (H) 0.61 - 1.24 mg/dL   Calcium 9.3 8.9 - 10.3 mg/dL   Total Protein 8.2 (H) 6.5 - 8.1 g/dL   Albumin 4.3 3.5 - 5.0 g/dL   AST 69 (H) 15 - 41 U/L   ALT 61 (H) 0 - 44 U/L   Alkaline Phosphatase 153 (H) 38 - 126 U/L   Total Bilirubin 2.0 (H) 0.3 - 1.2 mg/dL   GFR calc non Af Amer >60 >60 mL/min   GFR calc Af Amer >60 >60 mL/min    Comment: (NOTE) The eGFR has been calculated using the CKD EPI equation. This calculation has not been validated in all clinical situations. eGFR's persistently <60 mL/min signify possible Chronic Kidney Disease.    Anion gap 17 (H) 5 - 15    Comment: Performed at [facility], [place] [address] [place], [place] [address]  CBC     Status: Abnormal   Collection Time: 07/21/18  2:16 PM  Result  Value Ref Range   WBC 17.0 (H) 4.0 - 10.5 K/uL   RBC 4.44 4.22 - 5.81 MIL/uL   Hemoglobin 16.3 13.0 - 17.0 g/dL   HCT 46.7 39.0 - 52.0 %   MCV 105.2 (H) 78.0 - 100.0 fL   MCH 36.7 (H) 26.0 - 34.0 pg   MCHC 34.9 30.0 - 36.0 g/dL   RDW 13.9 11.5 - 15.5 %   Platelets 247 150 - 400 K/uL    Comment: Performed at [facility], [place] [address] [place], [place] [address]   Ct Abdomen Pelvis W Contrast  Result Date: 07/21/2018 CLINICAL DATA:  32 year old male with lower abdominal pain and vomiting. Abdominal distension. EXAM: CT ABDOMEN AND PELVIS WITH CONTRAST TECHNIQUE: Multidetector CT imaging of the abdomen and pelvis was performed using the standard protocol following bolus administration of intravenous contrast. CONTRAST:  192m ISOVUE-300 IOPAMIDOL (ISOVUE-300) INJECTION 61% COMPARISON:  CT chest, abdomen and Pelvis  09/29/2015 FINDINGS: Lower chest: Negative lung bases. No pericardial or pleural effusion. Hepatobiliary: Hepatomegaly (20 centimeter liver length) with steatosis. Small volume of perihepatic fluid. No discrete liver lesion. Negative gallbladder. No biliary ductal enlargement. Pancreas: Heterogeneous hypoenhancement throughout much of the pancreatic body and tail with confluent peripancreatic and lesser sac inflammation tracking to the splenic hilum and splenic flexure. Peripancreatic and regional free fluid with no organized or rim enhancing fluid collection. No pancreatic ductal enlargement. The head and uncinate process enhancement appears within normal limits. No peripancreatic lymphadenopathy. Spleen: Adjacent free fluid No discrete and inflammation splenic lesion. Adrenals/Urinary Tract: Left pararenal space inflammation and free fluid. Left adrenal gland remains normal. Renal enhancement remains symmetric. Normal right adrenal gland. No hydronephrosis or hydroureter. Diminutive and unremarkable urinary bladder. Stomach/Bowel: Decompressed descending and rectosigmoid colon. Free fluid in both paracolic gutters, more so the left, with simple fluid density. Inflammation surrounding the splenic flexure (series 2, image 39), appears secondary from the lesser sac. Transverse and splenic flexure wall thickening with areas of mesenteric stranding. Mostly decompressed transverse colon and right colon. Normal appendix despite some right lower quadrant free fluid (coronal image 47). Decompressed small bowel throughout the abdomen. Inflamed proximal stomach and distal duodenum secondary to the pancreas and lesser sac findings. No abdominal free air. Vascular/Lymphatic: The major arterial structures in the abdomen and pelvis are patent and appear normal. Portal venous system is patent, although the splenic vein is somewhat diminutive today. No lymphadenopathy. Reproductive: Negative. Other: Moderate volume of pelvic free  fluid with simple fluid density (12 Hounsfield units). Musculoskeletal: Mild dextroconvex scoliosis. No acute osseous abnormality identified. IMPRESSION: 1. Abnormal abdomen most consistent with Acute Pancreatitis. Enlarged and inflamed pancreatic tail without overt pancreatic necrosis. Secondary inflammation of the stomach, duodenum, transverse and left colon. Abdominal and pelvic free fluid with no organized fluid collection. No bowel obstruction or other complicating features. 2. Hepatomegaly and fatty liver disease. 3. Normal appendix.  Normal lung bases. Electronically Signed   By: HGenevie AnnM.D.   On: 07/21/2018 16:17    Pending Labs Unresulted Labs (From admission, onward)    Start     Ordered   07/22/18 0500  CBC with Differential/Platelet  Tomorrow morning,   R     07/21/18 1734   07/22/18 0500  Comprehensive metabolic panel  Tomorrow morning,   R     07/21/18 1734   07/22/18 0500  Magnesium  Tomorrow morning,   R     07/21/18 1734   07/22/18 0500  Phosphorus  Tomorrow morning,  R     07/21/18 1734   07/21/18 1416  Urinalysis, Routine w reflex microscopic  STAT,   STAT     07/21/18 1415   Signed and Held  HIV antibody (Routine Testing)  Once,   R     Signed and Held   Signed and Held  Creatinine, serum  (enoxaparin (LOVENOX)    CrCl >/= 30 ml/min)  Weekly,   R    Comments:  while on enoxaparin therapy    Signed and Held   Signed and Held  Phosphorus  Add-on,   R     Signed and Held   Signed and Held  Magnesium  Add-on,   R     Signed and Held   Signed and Held  Urine culture  Once,   R     Signed and Held   Signed and Held  TSH  Add-on,   R     Signed and Held   Signed and Held  Urinalysis, Complete w Microscopic  Once,   R     Signed and Held   Visual merchandiser and Held  Comprehensive metabolic panel  Tomorrow morning,   R     Signed and Held   Signed and Held  CBC with Differential/Platelet  Tomorrow morning,   R     Signed and Held   Signed and Held  Lipid panel  Tomorrow  morning,   R     Signed and Held   Signed and Held  Hemoglobin A1c  Tomorrow morning,   R     Signed and Held          Vitals/Pain Today's Vitals   07/21/18 1533 07/21/18 1600 07/21/18 1625 07/21/18 1730  BP: (!) 136/96 (!) 151/96  (!) 140/96  Pulse: (!) 131 (!) 127  (!) 127  Resp: (!) _0 Temp:      TempSrc:      SpO2: 99% 100%  99%  Weight:      Height:      PainSc:   7      Isolation Precautions No active isolations  Medications Medications  iopamidol (ISOVUE-300) 61 % injection (has no administration in time range)  sodium chloride 0.9 % bolus 1,000 mL (has no administration in time range)  LORazepam (ATIVAN) tablet 1 mg (has no administration in time range)    Or  LORazepam (ATIVAN) injection 1 mg (has no administration in time range)  thiamine (VITAMIN B-1) tablet 100 mg (has no administration in time range)    Or  thiamine (B-1) injection 100 mg (has no administration in time range)  folic acid (FOLVITE) tablet 1 mg (has no administration in time range)  multivitamin with minerals tablet 1 tablet (has no administration in time range)  albuterol (PROVENTIL) (2.5 MG/3ML) 0.083% nebulizer solution 2.5 mg (has no administration in time range)  nicotine (NICODERM CQ - dosed in mg/24 hours) patch 14 mg (has no administration in time range)  HYDROmorphone (DILAUDID) injection 1 mg (has no administration in time range)  sodium chloride 0.9 % bolus 1,000 mL (0 mLs Intravenous Stopped 07/21/18 1622)  HYDROmorphone (DILAUDID) injection 1 mg (1 mg Intravenous Given 07/21/18 1442)  iopamidol (ISOVUE-300) 61 % injection 100 mL (100 mLs Intravenous Contrast Given 07/21/18 1540)  sodium chloride 0.9 % bolus 1,000 mL (0 mLs Intravenous Stopped 07/21/18 1752)    Mobility walks
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
## 2018-07-21 NOTE — Telephone Encounter (Signed)
Provider Chestine Sporelark tried numerous times for the patient to go by the  ambulance to the ER due to his sickness symptoms. The patient refused the advice and said he was going by the car. I was a witnessed to this encounter. Danella DeisShay

## 2018-07-21 NOTE — Patient Instructions (Signed)
° ° ° °  If you have lab work done today you will be contacted with your lab results within the next 2 weeks.  If you have not heard from us then please contact us. The fastest way to get your results is to register for My Chart. ° ° °IF you received an x-ray today, you will receive an invoice from [facility]. Please contact [facility] at [phone] with questions or concerns regarding your invoice.  ° °IF you received labwork today, you will receive an invoice from [facility]. Please contact [facility] at [phone] with questions or concerns regarding your invoice.  ° °Our billing staff will not be able to assist you with questions regarding bills from these companies. ° °You will be contacted with the lab results as soon as they are available. The fastest way to get your results is to activate your My Chart account. Instructions are located on the last page of this paperwork. If you have not heard from us regarding the results in 2 weeks, please contact this office. °  ° ° ° °

## 2018-07-22 DIAGNOSIS — N179 Acute kidney failure, unspecified: Secondary | ICD-10-CM

## 2018-07-22 DIAGNOSIS — F1010 Alcohol abuse, uncomplicated: Secondary | ICD-10-CM

## 2018-07-22 DIAGNOSIS — K852 Alcohol induced acute pancreatitis without necrosis or infection: Principal | ICD-10-CM

## 2018-07-22 DIAGNOSIS — Z720 Tobacco use: Secondary | ICD-10-CM

## 2018-07-22 DIAGNOSIS — R6510 Systemic inflammatory response syndrome (SIRS) of non-infectious origin without acute organ dysfunction: Secondary | ICD-10-CM

## 2018-07-22 LAB — BASIC METABOLIC PANEL
Anion gap: 8 (ref 5–15)
BUN: 8 mg/dL (ref 6–20)
CO2: 28 mmol/L (ref 22–32)
Calcium: 8.2 mg/dL — ABNORMAL LOW (ref 8.9–10.3)
Chloride: 102 mmol/L (ref 98–111)
Creatinine, Ser: 0.85 mg/dL (ref 0.61–1.24)
GFR calc Af Amer: >60 mL/min (ref >60)
GFR calc non Af Amer: >60 mL/min (ref >60)
Glucose, Bld: 92 mg/dL (ref 70–99)
Potassium: 3.5 mmol/L (ref 3.5–5.1)
Sodium: 138 mmol/L (ref 135–145)

## 2018-07-22 LAB — CBC WITH DIFFERENTIAL/PLATELET
Basophils Absolute: 0 10*3/uL (ref 0.0–0.1)
Basophils Relative: 0 %
Eosinophils Absolute: 0.1 10*3/uL (ref 0.0–0.7)
Eosinophils Relative: 1 %
HCT: 36.3 % — ABNORMAL LOW (ref 39.0–52.0)
Hemoglobin: 12.4 g/dL — ABNORMAL LOW (ref 13.0–17.0)
LYMPHS ABS: 0.7 10*3/uL (ref 0.7–4.0)
LYMPHS PCT: 6 %
MCH: 36.2 pg — AB (ref 26.0–34.0)
MCHC: 34.2 g/dL (ref 30.0–36.0)
MCV: 105.8 fL — AB (ref 78.0–100.0)
MONO ABS: 0.7 10*3/uL (ref 0.1–1.0)
MONOS PCT: 6 %
Neutro Abs: 10.6 10*3/uL — ABNORMAL HIGH (ref 1.7–7.7)
Neutrophils Relative %: 87 %
Platelets: 171 10*3/uL (ref 150–400)
RBC: 3.43 MIL/uL — AB (ref 4.22–5.81)
RDW: 14.1 % (ref 11.5–15.5)
WBC: 12.1 10*3/uL — AB (ref 4.0–10.5)

## 2018-07-22 LAB — HEMOGLOBIN A1C
Hgb A1c MFr Bld: 4.3 % — ABNORMAL LOW (ref 4.8–5.6)
Mean Plasma Glucose: 76.71 mg/dL

## 2018-07-22 LAB — COMPREHENSIVE METABOLIC PANEL
ALT: 37 U/L (ref 0–44)
AST: 44 U/L — ABNORMAL HIGH (ref 15–41)
Albumin: 3.1 g/dL — ABNORMAL LOW (ref 3.5–5.0)
Alkaline Phosphatase: 110 U/L (ref 38–126)
Anion gap: 11 (ref 5–15)
BUN: 8 mg/dL (ref 6–20)
CALCIUM: 8.1 mg/dL — AB (ref 8.9–10.3)
CHLORIDE: 102 mmol/L (ref 98–111)
CO2: 23 mmol/L (ref 22–32)
CREATININE: 0.88 mg/dL (ref 0.61–1.24)
Glucose, Bld: 102 mg/dL — ABNORMAL HIGH (ref 70–99)
Potassium: 2.9 mmol/L — ABNORMAL LOW (ref 3.5–5.1)
Sodium: 136 mmol/L (ref 135–145)
Total Bilirubin: 1.7 mg/dL — ABNORMAL HIGH (ref 0.3–1.2)
Total Protein: 5.8 g/dL — ABNORMAL LOW (ref 6.5–8.1)

## 2018-07-22 LAB — LIPID PANEL
Cholesterol: 124 mg/dL (ref 0–200)
HDL: 35 mg/dL — ABNORMAL LOW (ref >40)
LDL Cholesterol: 61 mg/dL (ref 0–99)
Total CHOL/HDL Ratio: 3.5 RATIO
Triglycerides: 138 mg/dL (ref <150)
VLDL: 28 mg/dL (ref 0–40)

## 2018-07-22 LAB — MAGNESIUM: MAGNESIUM: 1.6 mg/dL — AB (ref 1.7–2.4)

## 2018-07-22 LAB — GLUCOSE, CAPILLARY: Glucose-Capillary: 109 mg/dL — ABNORMAL HIGH (ref 70–99)

## 2018-07-22 LAB — PHOSPHORUS: PHOSPHORUS: 2.3 mg/dL — AB (ref 2.5–4.6)

## 2018-07-22 MED ORDER — MAGNESIUM SULFATE 4 GM/100ML IV SOLN
4.0000 g | Freq: Once | INTRAVENOUS | Status: AC
  Administered 2018-07-22: 4 g via INTRAVENOUS
  Filled 2018-07-22: qty 100

## 2018-07-22 MED ORDER — POTASSIUM CHLORIDE CRYS ER 20 MEQ PO TBCR
40.0000 meq | EXTENDED_RELEASE_TABLET | ORAL | Status: AC
  Administered 2018-07-22 (×2): 40 meq via ORAL
  Filled 2018-07-22 (×2): qty 2

## 2018-07-22 MED ORDER — POTASSIUM CHLORIDE CRYS ER 20 MEQ PO TBCR
40.0000 meq | EXTENDED_RELEASE_TABLET | Freq: Once | ORAL | Status: AC
  Administered 2018-07-22: 40 meq via ORAL
  Filled 2018-07-22: qty 2

## 2018-07-22 MED ORDER — ENSURE ENLIVE PO LIQD
237.0000 mL | Freq: Two times a day (BID) | ORAL | Status: DC
  Administered 2018-07-23 – 2018-07-24 (×2): 237 mL via ORAL

## 2018-07-22 MED ORDER — SODIUM CHLORIDE 0.9 % IV BOLUS
1000.0000 mL | Freq: Once | INTRAVENOUS | Status: AC
  Administered 2018-07-22: 1000 mL via INTRAVENOUS
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
## 2018-07-23 LAB — COMPREHENSIVE METABOLIC PANEL
ALBUMIN: 2.9 g/dL — AB (ref 3.5–5.0)
ALT: 31 U/L (ref 0–44)
ANION GAP: 9 (ref 5–15)
AST: 39 U/L (ref 15–41)
Alkaline Phosphatase: 111 U/L (ref 38–126)
BUN: 6 mg/dL (ref 6–20)
CHLORIDE: 102 mmol/L (ref 98–111)
CO2: 23 mmol/L (ref 22–32)
Calcium: 8.1 mg/dL — ABNORMAL LOW (ref 8.9–10.3)
Creatinine, Ser: 0.79 mg/dL (ref 0.61–1.24)
GFR calc Af Amer: >60 mL/min (ref >60)
GFR calc non Af Amer: >60 mL/min (ref >60)
GLUCOSE: 101 mg/dL — AB (ref 70–99)
Potassium: 3.7 mmol/L (ref 3.5–5.1)
SODIUM: 134 mmol/L — AB (ref 135–145)
Total Bilirubin: 1.5 mg/dL — ABNORMAL HIGH (ref 0.3–1.2)
Total Protein: 5.8 g/dL — ABNORMAL LOW (ref 6.5–8.1)

## 2018-07-23 LAB — URINE CULTURE: Culture: <10000 — AB

## 2018-07-23 LAB — HIV ANTIBODY (ROUTINE TESTING W REFLEX): HIV SCREEN 4TH GENERATION: NONREACTIVE

## 2018-07-23 LAB — MAGNESIUM: MAGNESIUM: 2.2 mg/dL (ref 1.7–2.4)

## 2018-07-23 LAB — LIPASE, BLOOD: Lipase: 109 U/L — ABNORMAL HIGH (ref 11–51)

## 2018-07-23 LAB — GLUCOSE, CAPILLARY: Glucose-Capillary: 81 mg/dL (ref 70–99)

## 2018-07-23 NOTE — Care Management Note (Signed)
Case Management Note  Patient Details  Name: Frank Reese MRN: [id number] Date of Birth: 21-Jan-1986  Subjective/Objective:                  systemic inflammatory response secondary to acute pancreatitis likely secondary to ongoing alcohol abuse  Action/Plan: Following for cm needs and progression  Expected Discharge Date:  (unknown)               Expected Discharge Plan:  Home/Self Care  In-House Referral:  Clinical Social Work  Discharge planning Services  CM Consult  Post Acute Care Choice:    Choice offered to:     DME Arranged:    DME Agency:     HH Arranged:    HH Agency:     Status of Service:  In process, will continue to follow  If discussed at Long Length of Stay Meetings, dates discussed:    Additional Comments:  Golda AcreDavis, Romone Shaff Lynn, RN 07/23/2018, 12:47 PM
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
## 2018-07-24 DIAGNOSIS — R000 Tachycardia, unspecified: Secondary | ICD-10-CM

## 2018-07-24 DIAGNOSIS — R945 Abnormal results of liver function studies: Secondary | ICD-10-CM

## 2018-07-24 LAB — CBC
HCT: 27.7 % — ABNORMAL LOW (ref 39.0–52.0)
Hemoglobin: 9.8 g/dL — ABNORMAL LOW (ref 13.0–17.0)
MCH: 37 pg — AB (ref 26.0–34.0)
MCHC: 35.4 g/dL (ref 30.0–36.0)
MCV: 104.5 fL — AB (ref 78.0–100.0)
Platelets: 246 10*3/uL (ref 150–400)
RBC: 2.65 MIL/uL — ABNORMAL LOW (ref 4.22–5.81)
RDW: 13.4 % (ref 11.5–15.5)
WBC: 7.1 10*3/uL (ref 4.0–10.5)

## 2018-07-24 LAB — COMPREHENSIVE METABOLIC PANEL
ALK PHOS: 111 U/L (ref 38–126)
ALT: 37 U/L (ref 0–44)
ANION GAP: 7 (ref 5–15)
AST: 57 U/L — ABNORMAL HIGH (ref 15–41)
Albumin: 2.6 g/dL — ABNORMAL LOW (ref 3.5–5.0)
BILIRUBIN TOTAL: 1.2 mg/dL (ref 0.3–1.2)
BUN: 5 mg/dL — ABNORMAL LOW (ref 6–20)
CALCIUM: 8.3 mg/dL — AB (ref 8.9–10.3)
CO2: 26 mmol/L (ref 22–32)
CREATININE: 0.8 mg/dL (ref 0.61–1.24)
Chloride: 106 mmol/L (ref 98–111)
GFR calc non Af Amer: >60 mL/min (ref >60)
Glucose, Bld: 93 mg/dL (ref 70–99)
Potassium: 4 mmol/L (ref 3.5–5.1)
Sodium: 139 mmol/L (ref 135–145)
TOTAL PROTEIN: 5.4 g/dL — AB (ref 6.5–8.1)

## 2018-07-24 LAB — LIPASE, BLOOD: Lipase: 126 U/L — ABNORMAL HIGH (ref 11–51)

## 2018-07-24 LAB — GLUCOSE, CAPILLARY: Glucose-Capillary: 76 mg/dL (ref 70–99)

## 2018-07-24 MED ORDER — FOLIC ACID 1 MG PO TABS: 1.0000 mg | ORAL_TABLET | Freq: Every day | ORAL | Status: AC

## 2018-07-24 MED ORDER — THIAMINE HCL 100 MG PO TABS: 100.0000 mg | ORAL_TABLET | Freq: Every day | ORAL | Status: AC

## 2018-07-24 MED ORDER — NICOTINE 14 MG/24HR TD PT24: 14.0000 mg | MEDICATED_PATCH | Freq: Every day | TRANSDERMAL | 0 refills | Status: AC

## 2018-07-24 MED ORDER — ADULT MULTIVITAMIN W/MINERALS CH: 1.0000 | ORAL_TABLET | Freq: Every day | ORAL | Status: AC

## 2018-07-24 NOTE — Progress Notes (Signed)
Frank Reese to be D/C'd Home per MD order.  Discussed prescriptions and follow up appointments with the patient. Prescriptions given to patient, medication list explained in detail. Pt verbalized understanding.  Allergies as of 07/24/2018   No Known Allergies     Medication List    TAKE these medications   acetaminophen 500 MG tablet Commonly known as:  TYLENOL Take 1,000 mg by mouth every 6 (six) hours as needed for headache (For pain.).   folic acid 1 MG tablet Commonly known as:  FOLVITE Take 1 tablet (1 mg total) by mouth daily. Start taking on:  07/25/2018   multivitamin with minerals Tabs tablet Take 1 tablet by mouth daily. Start taking on:  07/25/2018   nicotine 14 mg/24hr patch Commonly known as:  NICODERM CQ - dosed in mg/24 hours Place 1 patch (14 mg total) onto the skin daily. Start taking on:  07/25/2018   thiamine 100 MG tablet Take 1 tablet (100 mg total) by mouth daily. Start taking on:  07/25/2018       Vitals:   07/23/18 2004 07/24/18 0539  BP: 130/90 136/88  Pulse: 92 71  Resp: 17 17  Temp: 99.3 F (37.4 C) 98.3 F (36.8 C)  SpO2: 100% 98%    Skin clean, dry and intact without evidence of skin break down, no evidence of skin tears noted. IV catheter discontinued intact. Site without signs and symptoms of complications. Dressing and pressure applied. Pt denies pain at this time. No complaints noted.  An After Visit Summary was printed and given to the patient. Patient escorted via WC, and D/C home via private auto.  Frank Reese, Frank Reese 07/24/2018 1:07 PM
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
## 2018-07-24 NOTE — Care Management Note (Signed)
Case Management Note  Patient Details  Name: Madelin Rearierre G Seyer MRN: [id number] Date of Birth: May 03, 1986  Subjective/Objective:                  discharged to home  Action/Plan: No cm needs present will sign off.  Patient wants to make his own md appointments.  Expected Discharge Date:  07/24/18               Expected Discharge Plan:  Home/Self Care  In-House Referral:  Clinical Social Work  Discharge planning Services  CM Consult  Post Acute Care Choice:    Choice offered to:     DME Arranged:    DME Agency:     HH Arranged:    HH Agency:     Status of Service:  Completed, signed off  If discussed at [employer] Meetings, dates discussed:    Additional Comments:  Golda AcreDavis, Jlyn Bracamonte Lynn, RN 07/24/2018, 12:43 PM

## 2019-08-02 IMAGING — US US ABDOMEN LIMITED
1 series · 14 of 25 positions shown · non-contrast
Comparison: 07/21/2018 CT

CLINICAL DATA: Pancreatitis

EXAM:
ULTRASOUND ABDOMEN LIMITED RIGHT UPPER QUADRANT

[Series 1: us abdomen limited · 14 of 74 slices shown]
[im 1/74]
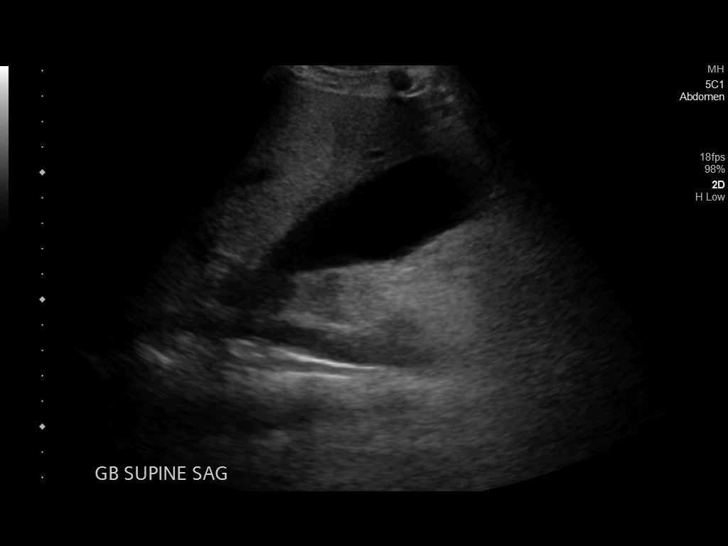
[im 7/74]
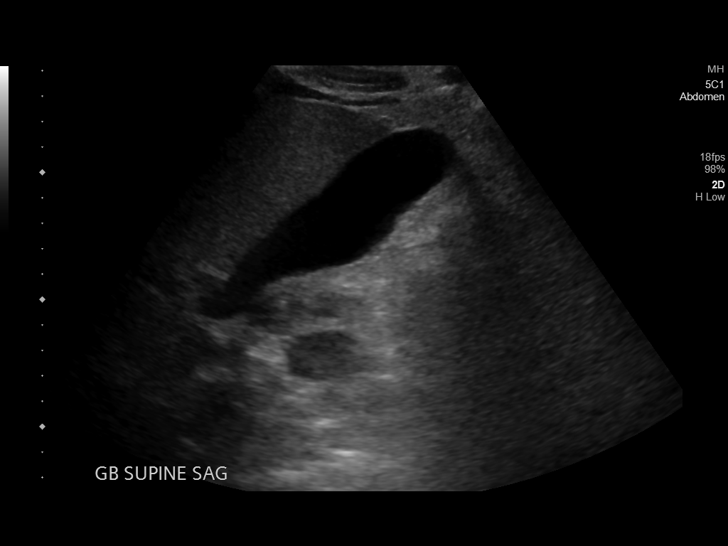
[im 13/74]
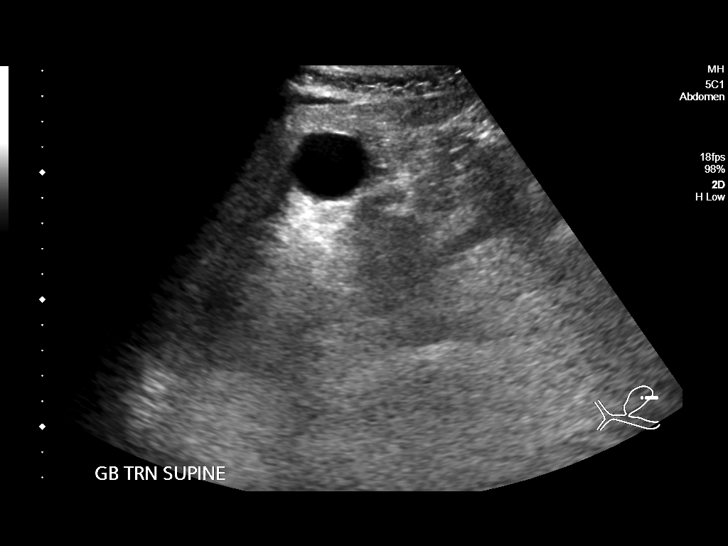
[im 19/74]
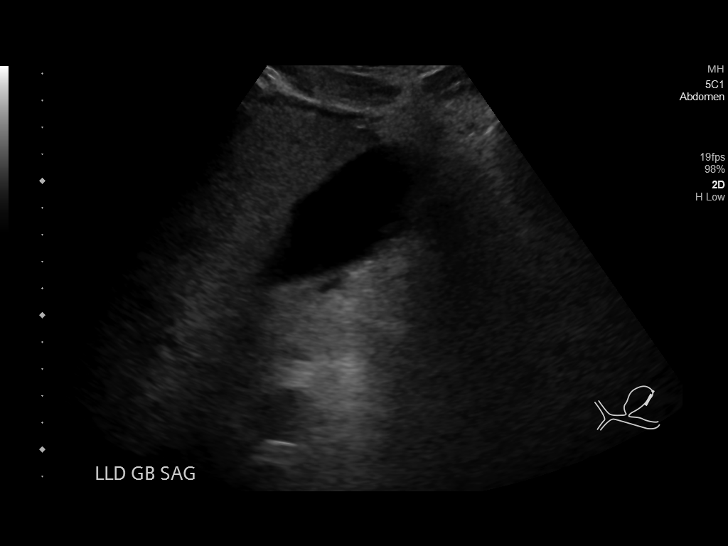
[im 25/74]
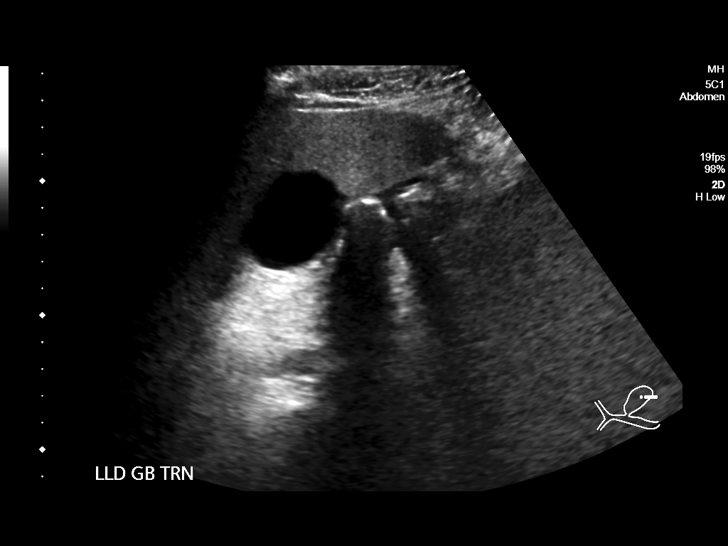
[im 28/74]
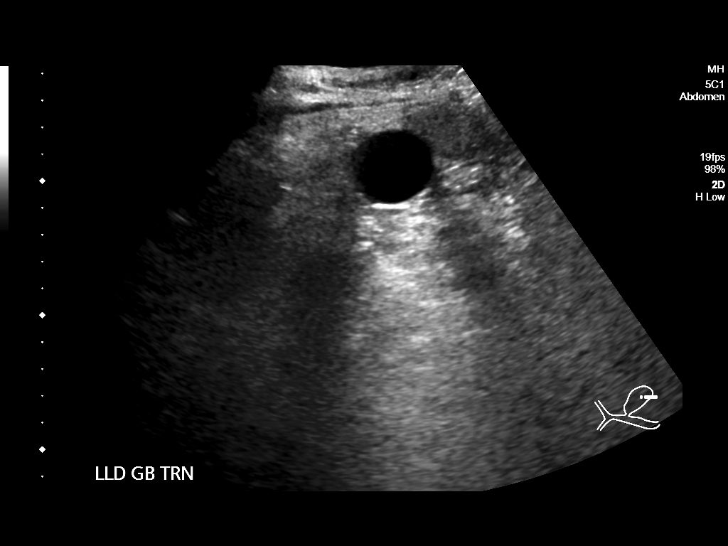
[im 34/74]
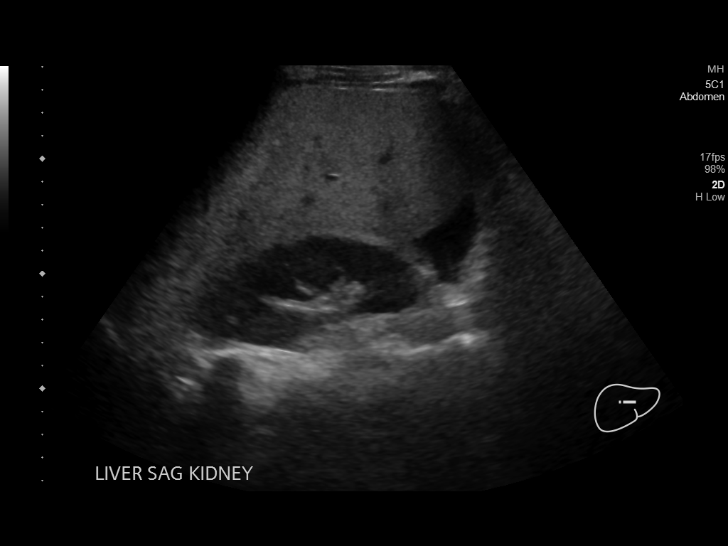
[im 40/74]
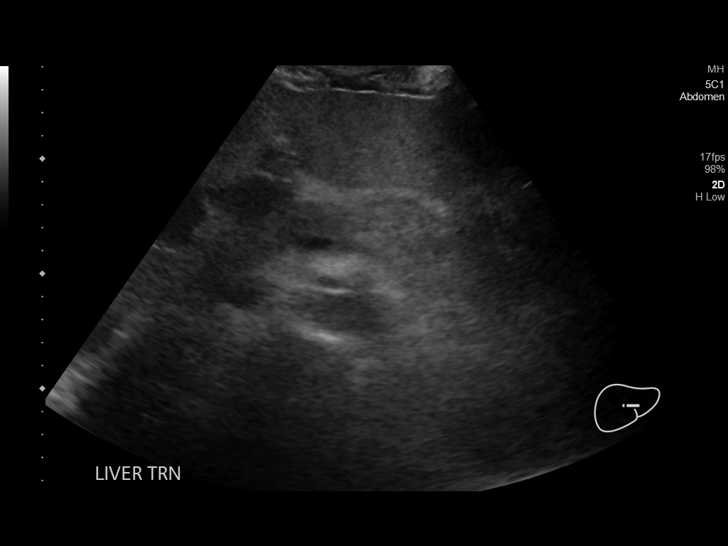
[im 46/74]
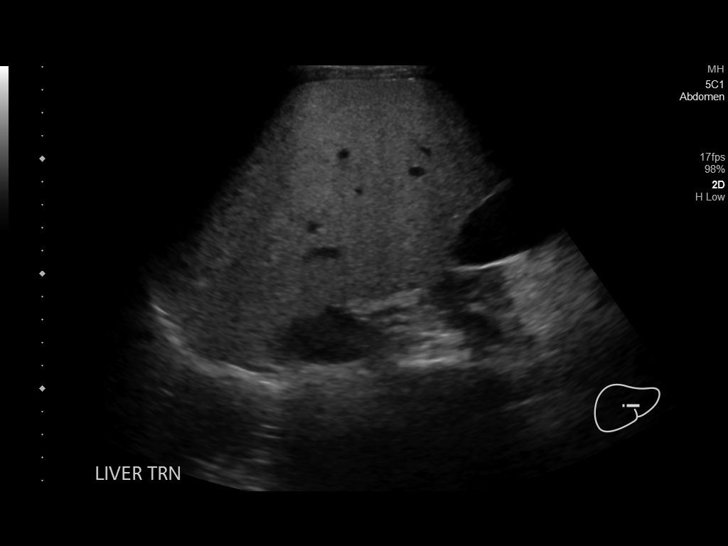
[im 49/74]
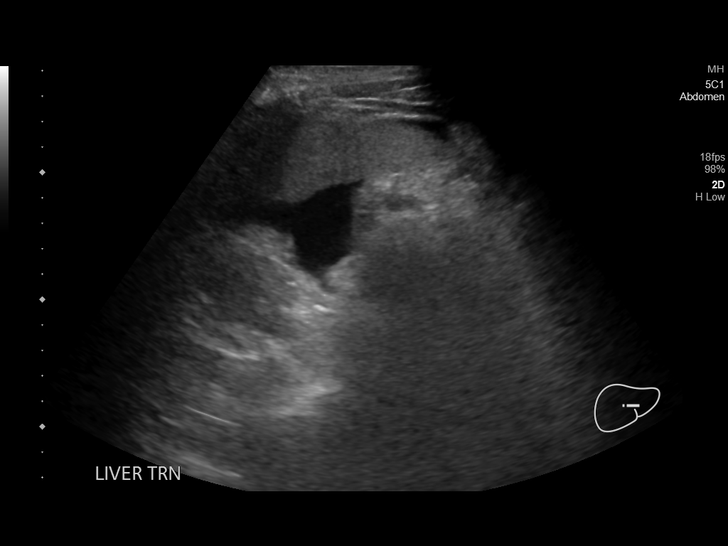
[im 55/74]
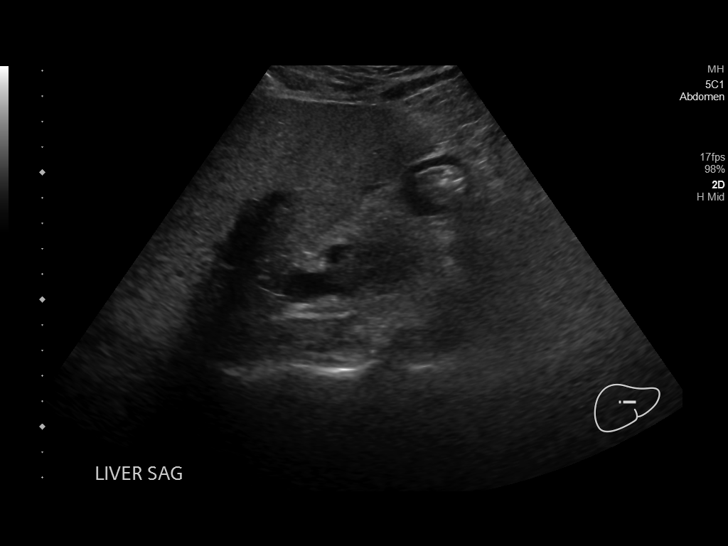
[im 61/74]
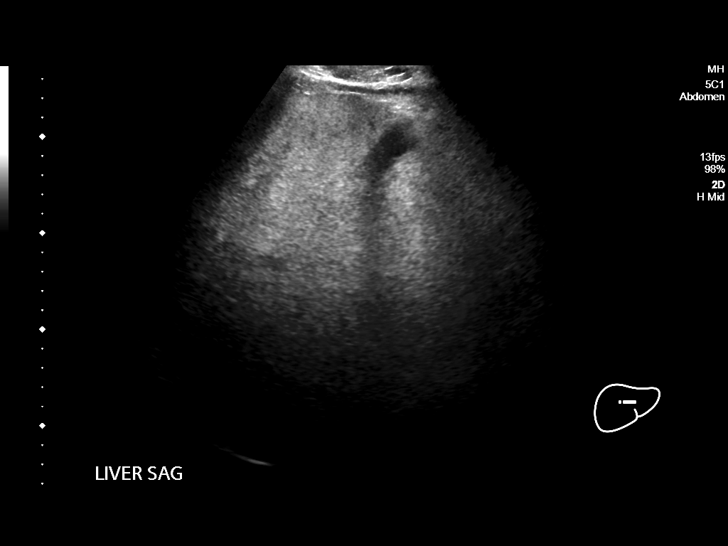
[im 67/74]
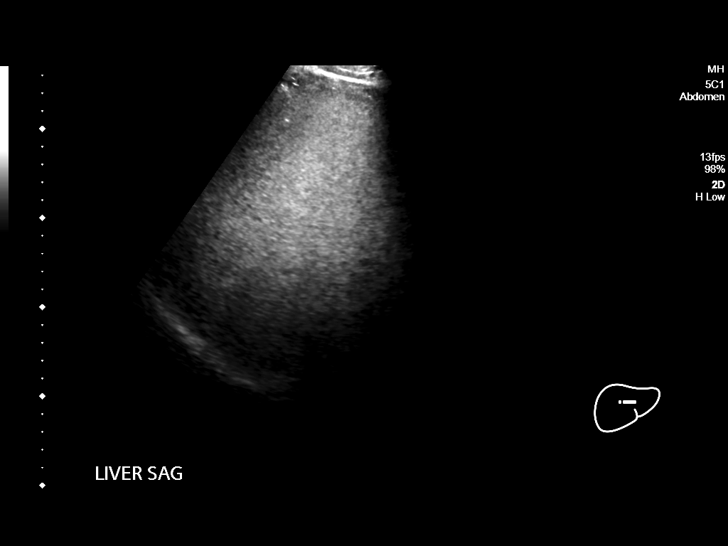
[im 74/74]
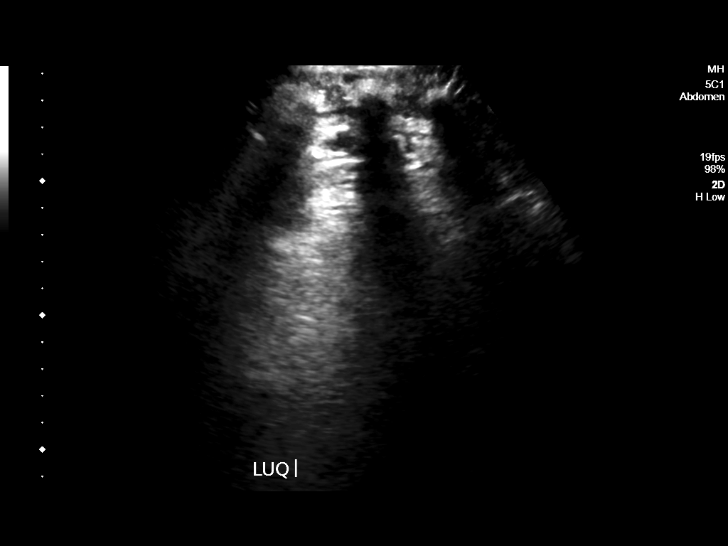

[14 of 25 positions shown; findings below may reference images not displayed]

FINDINGS: Gallbladder:

No gallstones or wall thickening visualized. No sonographic Murphy
sign noted by sonographer.

Common bile duct:

Diameter: 2.4 mm

Liver:

No focal lesion identified. Increased echogenicity of the liver
consistent with hepatic steatosis. Portal vein is patent on color
Doppler imaging with normal direction of blood flow towards the
liver. Small amount of perihepatic ascites.
IMPRESSION: 1. Small amount of perihepatic ascites with hepatic steatosis.
2. No secondary signs of acute cholecystitis.
3. Patent portal vein.  Normal directional flow.
